# Patient Record
Sex: Female | Born: 1945 | Race: White | Hispanic: No | Smoking: Never smoker
Health system: Southern US, Community
[De-identification: ages and names within clinical notes are randomized; demographics above are authoritative.]

## PROBLEM LIST (undated history)

## (undated) DIAGNOSIS — I10 Essential (primary) hypertension: Secondary | ICD-10-CM

## (undated) DIAGNOSIS — K5900 Constipation, unspecified: Secondary | ICD-10-CM

## (undated) HISTORY — DX: Constipation, unspecified: K59.00

---

## 2017-07-20 ENCOUNTER — Encounter: Payer: Self-pay | Admitting: Emergency Medicine

## 2017-07-20 ENCOUNTER — Other Ambulatory Visit: Payer: Self-pay

## 2017-07-20 ENCOUNTER — Emergency Department
Admission: EM | Admit: 2017-07-20 | Discharge: 2017-07-20 | Disposition: A | Discharge status: Home / Self Care | Payer: Medicare Other | Attending: Emergency Medicine | Admitting: Emergency Medicine

## 2017-07-20 DIAGNOSIS — Z760 Encounter for issue of repeat prescription: Secondary | ICD-10-CM

## 2017-07-20 DIAGNOSIS — I10 Essential (primary) hypertension: Secondary | ICD-10-CM | POA: Diagnosis not present

## 2017-07-20 HISTORY — DX: Essential (primary) hypertension: I10

## 2017-07-20 MED ORDER — HYDROCHLOROTHIAZIDE 25 MG PO TABS: 25.0000 mg | ORAL_TABLET | Freq: Every day | ORAL | 1 refills | Status: DC

## 2017-07-20 NOTE — ED Provider Notes (Signed)
[facility] Emergency Department Provider Note  ____________________________________________   First MD Initiated Contact with Patient 07/20/17 1329     (approximate)  I have reviewed the triage vital signs and the nursing notes.   HISTORY  Chief Complaint Medication Refill   HPI Aimee Pace is a 72 y.o. female is here for refill of her hydrochlorothiazide.  Patient states that she takes 25 mg for her blood pressure.  She states that [place] to the [place] area and [place] doctor in [place] will not send her prescription for this medication.  Patient denies any other symptoms.  She states she has been out of medication for 1 month.  She denies any edema to her lower extremities, shortness of breath or headache.  Past Medical History:  Diagnosis Date  . Hypertension     There are no active problems to display for this patient.   History reviewed. No pertinent surgical history.  Prior to Admission medications   Medication Sig Start Date End Date Taking? Authorizing Provider  hydrochlorothiazide (HYDRODIURIL) 25 MG tablet Take 1 tablet (25 mg total) by mouth daily. 07/20/17   Tommi RumpsSummers, Mcguire Gasparyan L, PA-C    Allergies Patient has no known allergies.  No family history on file.  Social History Social History   Tobacco Use  . Smoking status: Never Smoker  . Smokeless tobacco: Current User    Types: Snuff  Substance Use Topics  . Alcohol use: Not Currently  . Drug use: Never    Review of Systems Constitutional: No fever/chills Eyes: No visual changes. Cardiovascular: Denies chest pain. Respiratory: Denies shortness of breath. Musculoskeletal: Negative for back pain. Skin: Negative for rash. Neurological: Negative for headaches, focal weakness or numbness. ___________________________________________   PHYSICAL EXAM:  VITAL SIGNS: ED Triage Vitals  Enc Vitals Group     BP 07/20/17 1242 (!) 185/90     Pulse Rate  07/20/17 1242 76     Resp 07/20/17 1242 18     Temp 07/20/17 1242 98.3 F (36.8 C)     Temp Source 07/20/17 1242 Oral     SpO2 07/20/17 1242 98 %     Weight 07/20/17 1243 160 lb (72.6 kg)     Height 07/20/17 1243 5\' 7"  (1.702 m)     Head Circumference --      Peak Flow --      Pain Score 07/20/17 1243 0     Pain Loc --      Pain Edu? --      Excl. in [place]? --    Constitutional: Alert and oriented. Well appearing and in no acute distress. Eyes: Conjunctivae are normal.  Head: Atraumatic. Neck: No stridor.   Cardiovascular: Normal rate, regular rhythm. Grossly normal heart sounds.  Good peripheral circulation. Respiratory: Normal respiratory effort.  No retractions. Lungs CTAB. Gastrointestinal: Soft and nontender. No distention.  Musculoskeletal: No lower extremity tenderness nor edema.  No joint effusions. Neurologic:  Normal speech and language. No gross focal neurologic deficits are appreciated.  Skin:  Skin is warm, dry and intact. No rash noted. Psychiatric: Mood and affect are normal. Speech and behavior are normal.  ____________________________________________   LABS (all labs ordered are listed, but only abnormal results are displayed)  Labs Reviewed - No data to display  PROCEDURES  Procedure(s) performed: None  Procedures  Critical Care performed: No  ____________________________________________   INITIAL IMPRESSION / ASSESSMENT AND PLAN / ED COURSE  As part of my medical decision making, I  reviewed the following data within the electronic MEDICAL RECORD NUMBER Notes from prior ED visits and [place] Controlled Substance Database  Patient is in the emergency department to obtain a prescription for her blood pressure medication until she can be seen at [facility].  Patient has been out of hydrochlorothiazide for 1 month.  She denies any other medication.  She is here for no other medical reasons. ____________________________________________   FINAL CLINICAL  IMPRESSION(S) / ED DIAGNOSES  Final diagnoses:  Encounter for medication refill  Hypertension, unspecified type     ED Discharge Orders        Ordered    hydrochlorothiazide (HYDRODIURIL) 25 MG tablet  Daily     07/20/17 1406       Note:  This document was prepared using Dragon voice recognition software and may include unintentional dictation errors.    Tommi Rumps, PA-C 07/20/17 1413    Emily Filbert, MD 07/20/17 [phone]

## 2017-07-20 NOTE — ED Triage Notes (Signed)
Pt has been out of her HCTZ 25mg  PO daily for almost one month. [place]. Pt has appt in May.

## 2017-07-20 NOTE — ED Notes (Signed)
Pt ambulatory to POV without difficulty. VSS. NAD. Discharge instructions, RX and follow up discussed. All questions answered.  

## 2017-07-20 NOTE — Discharge Instructions (Addendum)
Keep your appointment with your doctor as scheduled.  You have a prescription for hydrochlorthiazide 25 mg to be taken once daily.  There is a prescription for 30 days along with 1 refill until you are seen.   Blood pressure initially in the emergency department was 185/90.  Prior to discharge blood pressure 120/89.

## 2017-07-20 NOTE — ED Notes (Signed)
See triage note  States she is out of her b/p meds  Last time she took any was about 1 month ago  Denies any sx's

## 2017-10-26 ENCOUNTER — Encounter: Payer: Self-pay | Admitting: *Deleted

## 2021-05-19 ENCOUNTER — Other Ambulatory Visit: Payer: Self-pay

## 2021-05-19 ENCOUNTER — Emergency Department: Payer: Medicare HMO

## 2021-05-19 ENCOUNTER — Emergency Department
Admission: EM | Admit: 2021-05-19 | Discharge: 2021-05-19 | Disposition: A | Discharge status: Home / Self Care | Payer: Medicare HMO | Attending: Emergency Medicine | Admitting: Emergency Medicine

## 2021-05-19 DIAGNOSIS — R339 Retention of urine, unspecified: Secondary | ICD-10-CM

## 2021-05-19 DIAGNOSIS — N390 Urinary tract infection, site not specified: Secondary | ICD-10-CM | POA: Diagnosis not present

## 2021-05-19 DIAGNOSIS — K5900 Constipation, unspecified: Secondary | ICD-10-CM | POA: Diagnosis present

## 2021-05-19 LAB — CBC
HCT: 40.8 % (ref 36.0–46.0)
Hemoglobin: 13.8 g/dL (ref 12.0–15.0)
MCH: 30.6 pg (ref 26.0–34.0)
MCHC: 33.8 g/dL (ref 30.0–36.0)
MCV: 90.5 fL (ref 80.0–100.0)
Platelets: 265 10*3/uL (ref 150–400)
RBC: 4.51 MIL/uL (ref 3.87–5.11)
RDW: 13 % (ref 11.5–15.5)
WBC: 9.1 10*3/uL (ref 4.0–10.5)
nRBC: 0 % (ref 0.0–0.2)

## 2021-05-19 LAB — COMPREHENSIVE METABOLIC PANEL
ALT: 12 U/L (ref 0–44)
AST: 20 U/L (ref 15–41)
Albumin: 3.7 g/dL (ref 3.5–5.0)
Alkaline Phosphatase: 45 U/L (ref 38–126)
Anion gap: 13 (ref 5–15)
BUN: 25 mg/dL — ABNORMAL HIGH (ref 8–23)
CO2: 29 mmol/L (ref 22–32)
Calcium: 9.5 mg/dL (ref 8.9–10.3)
Chloride: 98 mmol/L (ref 98–111)
Creatinine, Ser: 0.88 mg/dL (ref 0.44–1.00)
GFR, Estimated: >60 mL/min (ref >60)
Glucose, Bld: 124 mg/dL — ABNORMAL HIGH (ref 70–99)
Potassium: 2.9 mmol/L — ABNORMAL LOW (ref 3.5–5.1)
Sodium: 140 mmol/L (ref 135–145)
Total Bilirubin: 0.8 mg/dL (ref 0.3–1.2)
Total Protein: 7.2 g/dL (ref 6.5–8.1)

## 2021-05-19 LAB — URINALYSIS, ROUTINE W REFLEX MICROSCOPIC
Bilirubin Urine: NEGATIVE
Glucose, UA: NEGATIVE mg/dL
Ketones, ur: 5 mg/dL — AB
Nitrite: NEGATIVE
Protein, ur: 30 mg/dL — AB
Specific Gravity, Urine: 1.024 (ref 1.005–1.030)
WBC, UA: >50 WBC/hpf — ABNORMAL HIGH (ref 0–5)
pH: 5 (ref 5.0–8.0)

## 2021-05-19 LAB — LIPASE, BLOOD: Lipase: 22 U/L (ref 11–51)

## 2021-05-19 IMAGING — CT CT ABD-PELV W/ CM
2 of 5 series · 15 of 46 positions shown, 17 images · IV contrast (APPLIED)
Comparison: None.

CLINICAL DATA: Abdominal pain.  Constipation

EXAM:
CT ABDOMEN AND PELVIS WITH CONTRAST
TECHNIQUE: Multidetector CT imaging of the abdomen and pelvis was performed
using the standard protocol following bolus administration of
intravenous contrast.

[Series 2: routine abd/pel with · axial · 0.77mm/px · z∈[-675,-275]mm · 12 of 92 slices shown, 14 images]
[im 6/92  soft-tissue]
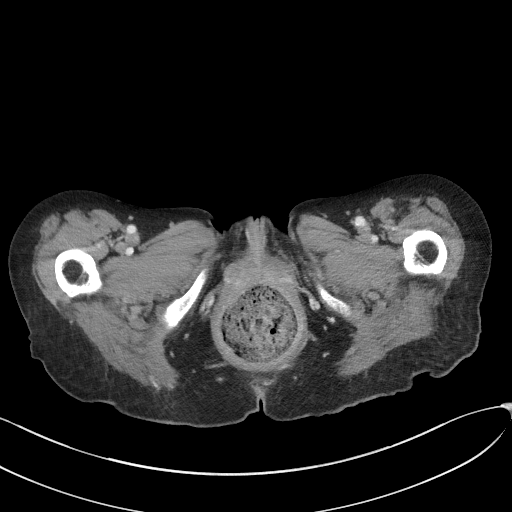
[im 6/92  bone]
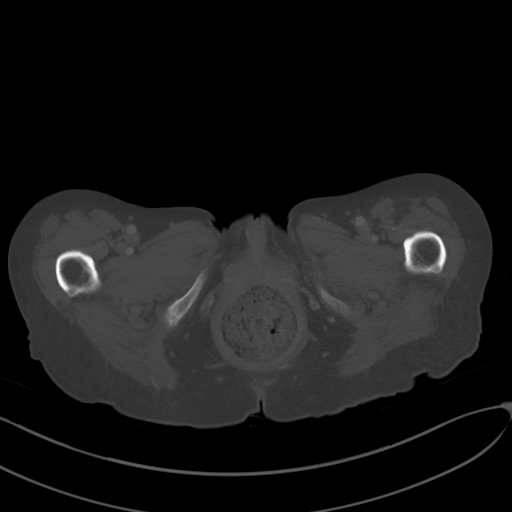
[im 12/92  soft-tissue]
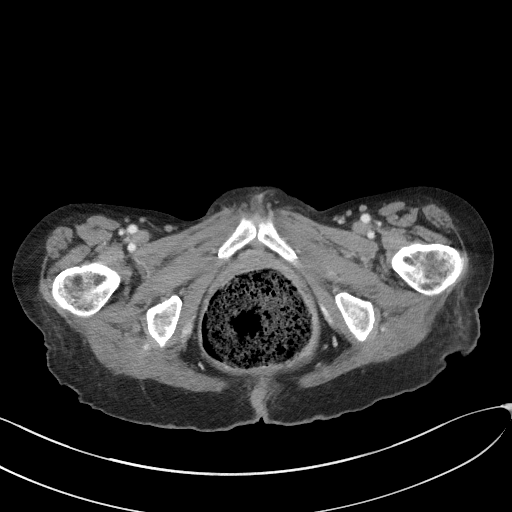
[im 23/92  soft-tissue]
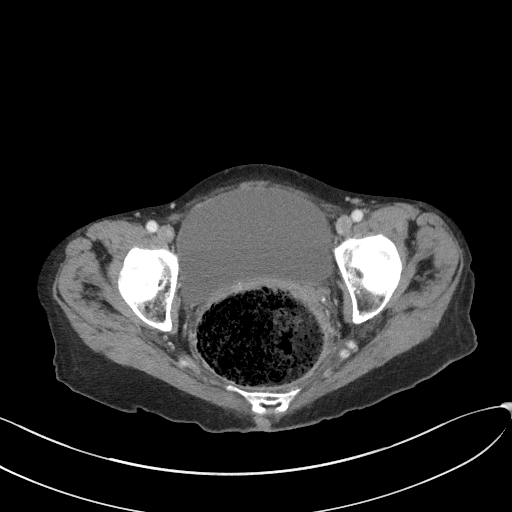
[im 29/92  soft-tissue]
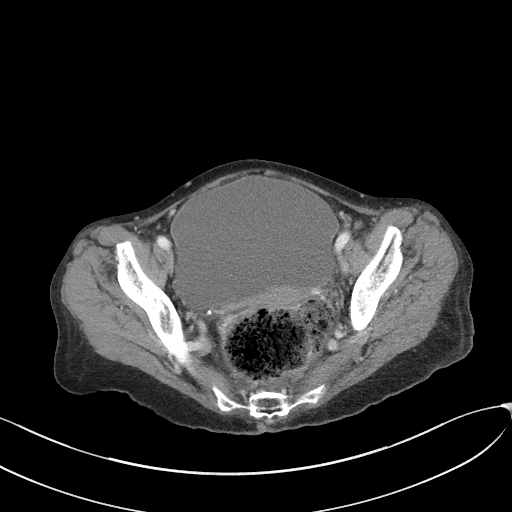
[im 35/92  soft-tissue]
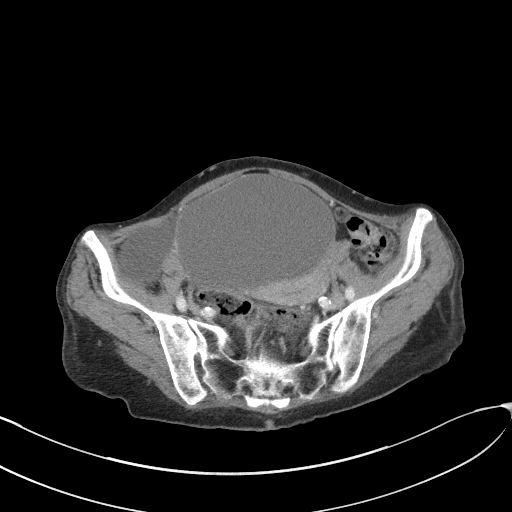
[im 40/92  soft-tissue]
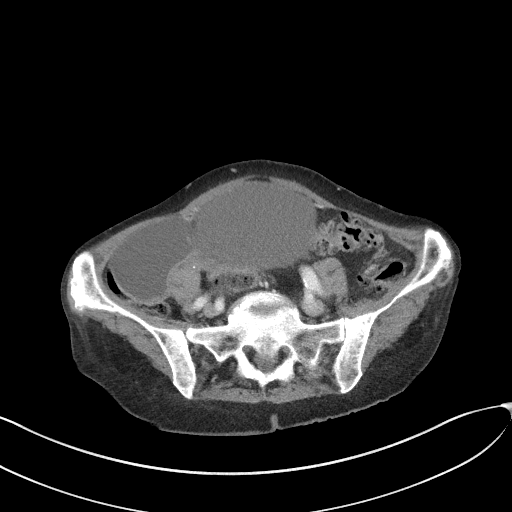
[im 52/92  soft-tissue]
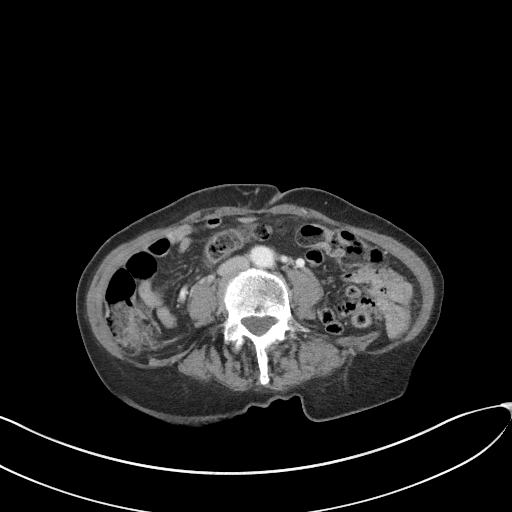
[im 57/92  soft-tissue]
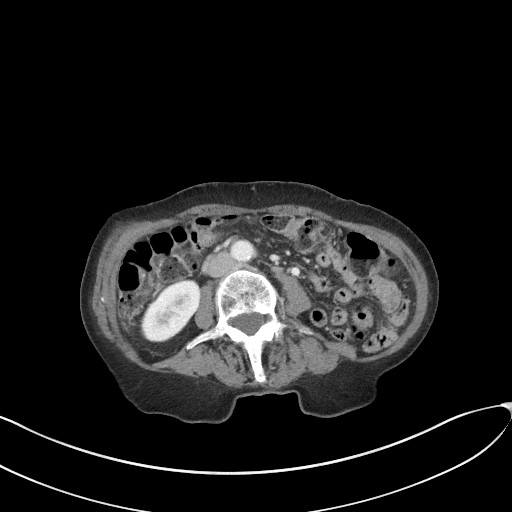
[im 63/92  soft-tissue]
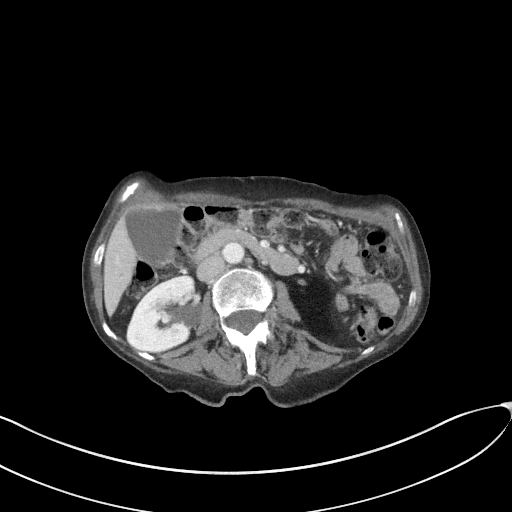
[im 63/92  bone]
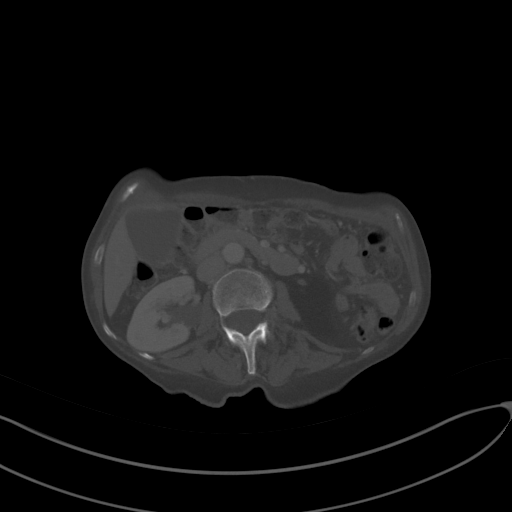
[im 69/92  soft-tissue]
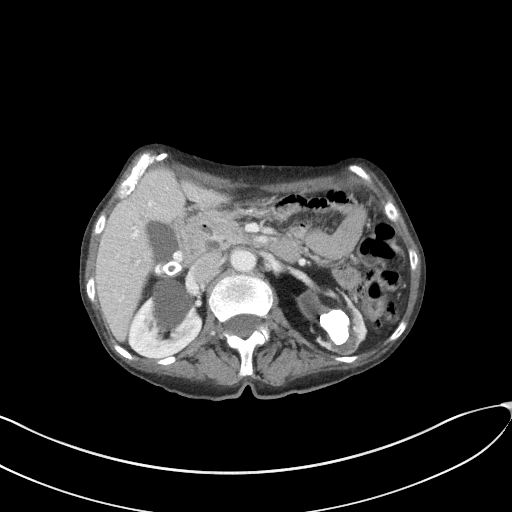
[im 80/92  soft-tissue]
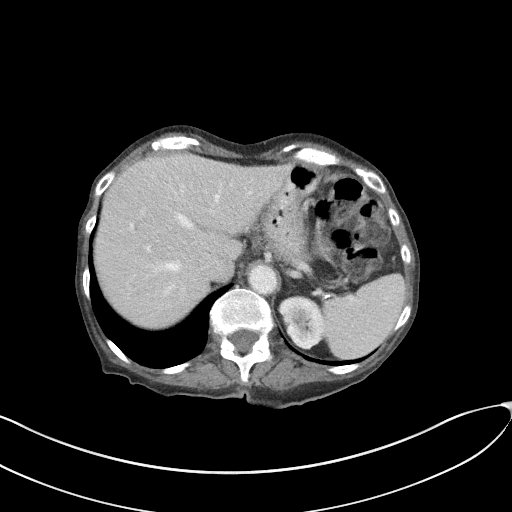
[im 86/92  soft-tissue]
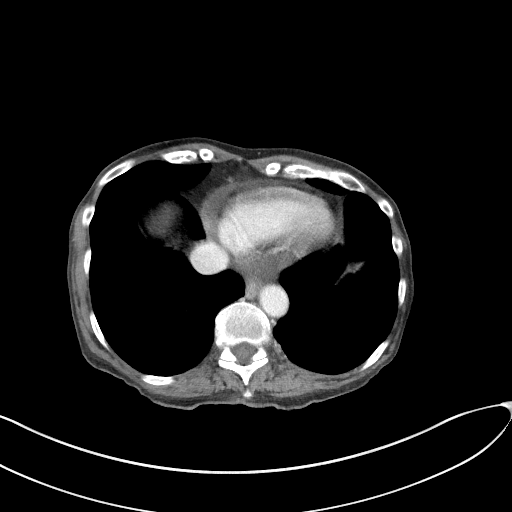

[Series 5: coronal st · coronal · 0.74mm/px · 3 of 72 slices shown]
[im 24/72  soft-tissue]
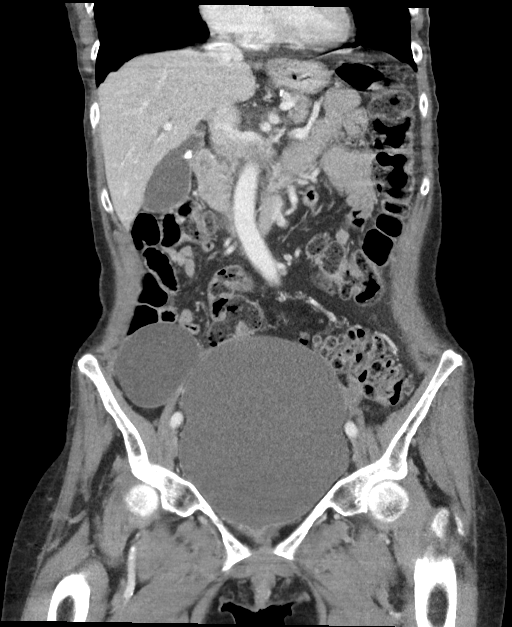
[im 32/72  soft-tissue]
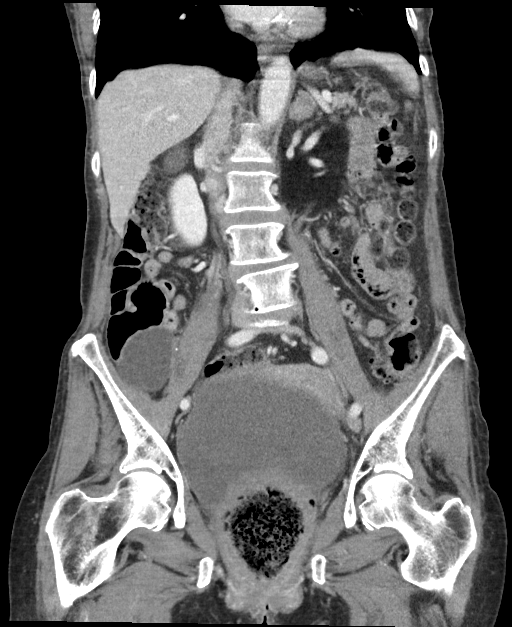
[im 40/72  soft-tissue]
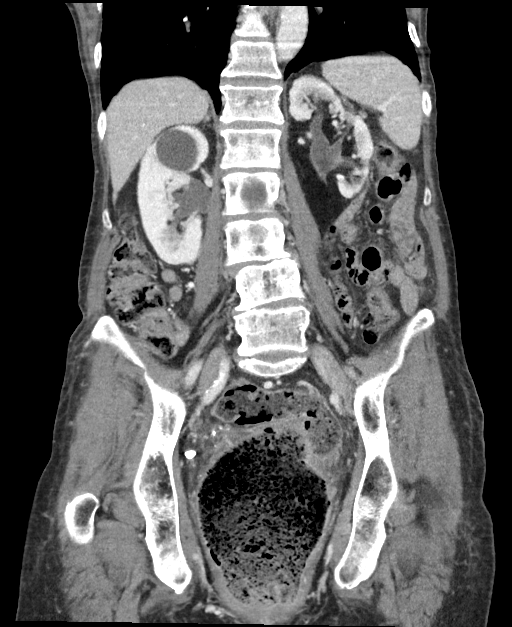

[15 of 46 positions shown; findings below may reference images not displayed]

RADIATION DOSE REDUCTION: This exam was performed according to the
departmental dose-optimization program which includes automated
exposure control, adjustment of the mA and/or kV according to
patient size and/or use of iterative reconstruction technique.

CONTRAST:  100mL OMNIPAQUE IOHEXOL 300 MG/ML  SOLN
FINDINGS: Lower chest: Lung bases are clear.

Hepatobiliary: Small hypodense lesion in the RIGHT hepatic lobe
measuring 4 mm (image 13) is too small to characterize

Several large faceted gallstones are present. No evidence acute
cholecystitis. No intrahepatic duct dilatation. The extrahepatic
bile duct is moderately dilated with the common bile duct measuring
9 mm (image 22/series 5). Within the distal common bile duct there
is soft tissue density measuring 6 mm (image [DATE]).

Pancreas: No pancreatic ductal dilatation or inflammation.

Spleen: Normal spleen

Adrenals/urinary tract: Thickening of the LEFT adrenal gland to 2 cm
(image [DATE]). Thickened LEFT adrenal gland meets contrast washout
criteria (greater than 40%) consistent with a benign adenoma. RIGHT
adrenal gland normal.

There is a bulky calcification in the LEFT renal hilum measuring
x 1.8 cm. There is chronic renal cortical thinning of the LEFT
kidney. No hydroureter on the LEFT.

The RIGHT kidney is normal. There is a nonenhancing cyst in the mid
RIGHT kidney measuring 3.6 cm.

The bladder is distended measuring 14.5 x 12.2 x 8.9 cm (volume =
820 cm^3).

Stomach/Bowel: Stomach, duodenum and small-bowel normal. Appendix
not clearly identified. Ascending and transverse colon normal.
Multiple diverticula descending colon sigmoid colon. Normal stool
volume in the descending colon. Large volume stool expands the
rectal vault to 10.0 7.8 cm in axial dimension (image 79/2)

Vascular/Lymphatic: Abdominal aorta is normal caliber. No periportal
or retroperitoneal adenopathy. No pelvic adenopathy.

Reproductive: Uterus is normal. Large cystic lesion in the RIGHT
iliac fossa has a thin rim of soft tissue suggesting RIGHT ovary
origin. Cyst measures 6.1 x 4.5 cm (image 54/series 2). The density
of fluid within the cyst is less than urine within the bladder. LEFT
ovary normal.

Other: No free fluid.

Musculoskeletal: No aggressive osseous lesion.
IMPRESSION: 1. Large volume of stool fills the rectal vault. Findings suggest
constipation. Minimal stool volume elsewhere in the colon.
2. LEFT colon diverticulosis without evidence acute diverticulitis.
3. Extrahepatic biliary duct dilatation with potential small filling
defect within the distal common bile duct. This filling defect is
less dense than the stones within the gallbladder may represent
sludge. No evidence acute cholecystitis. Recommend correlation with
bilirubin levels and if elevated consider further evaluation.
4. Markedly distended bladder (820 cc). Large calculus within the
LEFT renal pelvis with chronic LEFT renal cortical atrophy.
5. Benign LEFT adrenal adenoma.
6. Cystic enlargement of the RIGHT ovary. Recommend follow-up US in
6-12 months. Note: This recommendation does not apply to
premenarchal patients and to those with increased risk (genetic,
family history, elevated tumor markers or other high-risk factors)
of ovarian cancer. Reference: JACR [DATE]):248-254

## 2021-05-19 MED ORDER — DOCUSATE SODIUM 100 MG PO CAPS: 100.0000 mg | ORAL_CAPSULE | Freq: Two times a day (BID) | ORAL | 0 refills | Status: AC

## 2021-05-19 MED ORDER — POTASSIUM CHLORIDE CRYS ER 10 MEQ PO TBCR: 10.0000 meq | EXTENDED_RELEASE_TABLET | Freq: Two times a day (BID) | ORAL | 0 refills | Status: DC

## 2021-05-19 MED ORDER — CEPHALEXIN 500 MG PO CAPS: 500.0000 mg | ORAL_CAPSULE | Freq: Three times a day (TID) | ORAL | 0 refills | Status: DC

## 2021-05-19 MED ORDER — IOHEXOL 300 MG/ML  SOLN
100.0000 mL | Freq: Once | INTRAMUSCULAR | Status: AC | PRN
  Administered 2021-05-19: 100 mL via INTRAVENOUS
  Filled 2021-05-19: qty 100

## 2021-05-19 MED ORDER — SODIUM CHLORIDE 0.9 % IV SOLN
1.0000 g | Freq: Once | INTRAVENOUS | Status: AC
  Administered 2021-05-19: 1 g via INTRAVENOUS
  Filled 2021-05-19: qty 10

## 2021-05-19 MED ORDER — POLYETHYLENE GLYCOL 3350 17 GM/SCOOP PO POWD: ORAL | 0 refills | Status: DC

## 2021-05-19 MED ORDER — SODIUM CHLORIDE 0.9 % IV BOLUS
1000.0000 mL | Freq: Once | INTRAVENOUS | Status: AC
  Administered 2021-05-19: 1000 mL via INTRAVENOUS

## 2021-05-19 NOTE — ED Notes (Signed)
Pt to ED brought by nephew, pt states LBM was 10 days ago and has abdominal discomfort in lower abdomen. Pt also states that rectum is sore. Pt denies urinary symptoms.  Pt has unkept appearance and smells of urine and feces. Clothing appears to have dried feces and back of pants appear wet. Will clean up pt with other nurse.   EDP examining pt.

## 2021-05-19 NOTE — ED Notes (Signed)
Tried to call family of pt with 2 listed numbers. No answer at either number. Pt is up for discharge.

## 2021-05-19 NOTE — ED Triage Notes (Signed)
Pt here with abd pain and constipation. Pt states that she has not had a BM in 7 days. Pt has generalized abd pain, Pt in NAD in triage.

## 2021-05-19 NOTE — ED Notes (Signed)
Peri care was performed and foley catheter was placed. EDP at bedside for manual disimpaction.

## 2021-05-19 NOTE — ED Provider Notes (Signed)
[facility] Provider Note    Event Date/Time   First MD Initiated Contact with Patient 05/19/21 1206     (approximate)  History   Chief Complaint: Abdominal Pain and Constipation  HPI  Aimee Pace is a 76 y.o. female with a past medical history of hypertension presents to the emergency department for constipation and abdominal pain.  According to the patient for the past week or week and a half she has been experiencing constipation as well as lower abdominal pain.  Denies any fever denies any nausea or vomiting.  Describes abdominal pain as mild to moderate and intermittent across the lower abdomen mostly in the left lower quadrant.  Denies any urinary symptoms.  Physical Exam   Triage Vital Signs: ED Triage Vitals  Enc Vitals Group     BP 05/19/21 1146 (!) 145/111     Pulse Rate 05/19/21 1146 (!) 109     Resp 05/19/21 1146 18     Temp 05/19/21 1146 98.3 F (36.8 C)     Temp Source 05/19/21 1146 Oral     SpO2 05/19/21 1146 99 %     Weight 05/19/21 1147 118 lb (53.5 kg)     Height 05/19/21 1147 5\' 7"  (1.702 m)     Head Circumference --      Peak Flow --      Pain Score 05/19/21 1146 10     Pain Loc --      Pain Edu? --      Excl. in [place]? --     Most recent vital signs: Vitals:   05/19/21 1146  BP: (!) 145/111  Pulse: (!) 109  Resp: 18  Temp: 98.3 F (36.8 C)  SpO2: 99%    General: Awake, no distress.  CV:  Good peripheral perfusion.  Regular rate and rhythm  Resp:  Normal effort.  Equal breath sounds bilaterally.  Abd:  Soft, mild left lower quadrant tenderness to palpation without rebound guarding or distention.  Otherwise benign abdomen.    ED Results / Procedures / Treatments   RADIOLOGY  I have personally reviewed the CT images patient appears to have significant bladder distention as well as significant rectal stool/impaction. CT read by radiology as IMPRESSION:  1. Large volume of stool fills the rectal vault. Findings  suggest  constipation. Minimal stool volume elsewhere in the colon.  2. LEFT colon diverticulosis without evidence acute diverticulitis.  3. Extrahepatic biliary duct dilatation with potential small filling  defect within the distal common bile duct. This filling defect is  less dense than the stones within the gallbladder may represent  sludge. No evidence acute cholecystitis. Recommend correlation with  bilirubin levels and if elevated consider further evaluation.  4. Markedly distended bladder (820 cc). Large calculus within the  LEFT renal pelvis with chronic LEFT renal cortical atrophy.  5. Benign LEFT adrenal adenoma.  6. Cystic enlargement of the RIGHT ovary. Recommend follow-up US in  6-12 months. Note: This recommendation does not apply to  premenarchal patients and to those with increased risk (genetic,  family history, elevated tumor markers or other high-risk factors)  of ovarian cancer. Reference: JACR 2020 Feb; 17(2):248-254    MEDICATIONS ORDERED IN ED: Medications  sodium chloride 0.9 % bolus 1,000 mL (has no administration in time range)     IMPRESSION / MDM / ASSESSMENT AND PLAN / ED COURSE  I reviewed the triage vital signs and the nursing notes.  Patient presents to the emergency department for  constipation x10 days and lower abdominal pain.  Given the patient's constipation x10 days with tenderness to palpation in the left lower quadrant we will obtain CT imaging the abdomen/pelvis to further evaluate.  Differential would include diverticulitis, colitis, proctitis, constipation, bowel obstruction.  We will also check labs to ensure no metabolic or electrolyte abnormality dehydration, etc.  We will check a urinalysis as well to ensure no UTI.  Patient agreeable to plan of care.  Patient's lab work is overall reassuring slight hypokalemia we will place on oral supplementation.  CBC is normal including a normal white blood cell count.  Urine is pending.  Patient's  urinalysis shows significant urinary tract infection.  We will dose IV Rocephin.  CT images show significant bladder distention we will place a Foley catheter.  Concerning for possible fecal impaction.  I have performed a digital disimpaction and removed a very large amount of stool from the patient's rectum.  Remaining stool is soft.  We will perform an enema and continue to closely monitor.  Overall patient appears well and I believe would be appropriate for discharge home given her reassuring work-up with close follow-up.  We will leave the urinary catheter in place, place the patient on Keflex 3 times daily for 10 days as well as Colace twice daily.  Patient agreeable to plan of care.    FINAL CLINICAL IMPRESSION(S) / ED DIAGNOSES   Low abdominal pain Constipation Urinary retention Urinary tract infection  Rx / DC Orders   Please follow-up with your primary care doctor in the next 2 to 3 days for recheck/reevaluation. Keflex Colace Potassium supplements x7 days  Note:  This document was prepared using Dragon voice recognition software and may include unintentional dictation errors.   Harvest Dark, MD 05/19/21 [phone]

## 2021-05-19 NOTE — Discharge Instructions (Addendum)
Your evaluation today revealed significant constipation as well as a urinary tract infection and urinary retention.  A catheter was used to drain your bladder, and you were given antibiotics to treat the bladder infection.  Please follow-up with your doctor.  Please finish a course of prescribed antibiotics.  Please use your stool softener twice daily as prescribed.  Please drink plenty of fluids and take your potassium supplements as written for the next 7 days.  Please follow-up with your doctor in the next 2 to 3 days for recheck/reevaluation to ensure you are able to urinate normally.

## 2021-05-19 NOTE — ED Provider Notes (Signed)
Procedures     ----------------------------------------- 6:15 PM on 05/19/2021 ----------------------------------------- Patient reports her pain is resolved.  She feels very comfortable currently.  No rectal pain or pressure.  She did have a bowel movement here in the ED.  Abdominal exam is soft, nondistended, completely nontender.  Stable for discharge at this time.     Sharman Cheek, MD 05/19/21 1816

## 2021-05-19 NOTE — ED Notes (Signed)
Pt cleaned into new brief and put on new pants. Pt nephew at bedside. Discharge papers, and prescriptions explained to nephew.

## 2021-05-19 NOTE — ED Notes (Signed)
Pt assisted to bedside commode at this time. Call bell in reach and pt instructed not to get up without calling for help. Pt verbalized understanding.

## 2021-05-20 LAB — URINE CULTURE

## 2022-04-01 ENCOUNTER — Other Ambulatory Visit: Payer: Self-pay | Admitting: Nurse Practitioner

## 2022-04-01 DIAGNOSIS — Z780 Asymptomatic menopausal state: Secondary | ICD-10-CM

## 2022-04-01 DIAGNOSIS — Z1231 Encounter for screening mammogram for malignant neoplasm of breast: Secondary | ICD-10-CM

## 2024-04-30 ENCOUNTER — Ambulatory Visit: Payer: Self-pay | Admitting: Internal Medicine

## 2024-04-30 ENCOUNTER — Encounter: Payer: Self-pay | Admitting: Internal Medicine

## 2024-04-30 VITALS — BP 126/84 | HR 94 | Temp 98.9°F | Ht 67.0 in | Wt 128.2 lb

## 2024-04-30 DIAGNOSIS — I10 Essential (primary) hypertension: Secondary | ICD-10-CM | POA: Insufficient documentation

## 2024-04-30 DIAGNOSIS — Z0001 Encounter for general adult medical examination with abnormal findings: Secondary | ICD-10-CM

## 2024-04-30 DIAGNOSIS — Z780 Asymptomatic menopausal state: Secondary | ICD-10-CM | POA: Diagnosis not present

## 2024-04-30 MED ORDER — HYDROCHLOROTHIAZIDE 25 MG PO TABS: 25.0000 mg | ORAL_TABLET | Freq: Every day | ORAL | 1 refills | Status: AC

## 2024-04-30 MED ORDER — LISINOPRIL 5 MG PO TABS: 5.0000 mg | ORAL_TABLET | Freq: Every day | ORAL | 1 refills | Status: AC

## 2024-04-30 NOTE — Progress Notes (Signed)
 "  Established Patient Office Visit  Subjective:  Patient ID: Aimee Pace, female    DOB: 1946-01-15  Age: 79 y.o. MRN: [id number]  Chief Complaint  Patient presents with   Establish Care    NPE    Here to establish IM care with pmh of HTN. No complaints today.    No other concerns at this time.   Past Medical History:  Diagnosis Date   Constipation    Hypertension     History reviewed. No pertinent surgical history.  Social History   Socioeconomic History   Marital status: Widowed    Spouse name: Not on file   Number of children: Not on file   Years of education: Not on file   Highest education level: Not on file  Occupational History   Not on file  Tobacco Use   Smoking status: Never   Smokeless tobacco: Current    Types: Chew  Vaping Use   Vaping status: Never Used  Substance and Sexual Activity   Alcohol use: Not Currently   Drug use: Never   Sexual activity: Not Currently  Other Topics Concern   Not on file  Social History Narrative   Not on file   Social Drivers of Health   Tobacco Use: High Risk (04/30/2024)   Patient History    Smoking Tobacco Use: Never    Smokeless Tobacco Use: Current    Passive Exposure: Not on file  Financial Resource Strain: Not on file  Food Insecurity: Not on file  Transportation Needs: Not on file  Physical Activity: Not on file  Stress: Not on file  Social Connections: Not on file  Intimate Partner Violence: Not on file  Depression (EYV7-0): Not on file  Alcohol Screen: Not on file  Housing: Not on file  Utilities: Not on file  Health Literacy: Not on file    History reviewed. No pertinent family history.  Allergies[1]  Show/hide medication list[2]  Review of Systems  Constitutional: Negative.  Negative for malaise/fatigue and weight loss.  HENT: Negative.    Eyes: Negative.   Respiratory: Negative.    Cardiovascular: Negative.   Gastrointestinal: Negative.   Genitourinary: Negative.   Skin: Negative.    Neurological: Negative.   Endo/Heme/Allergies: Negative.        Objective:   BP 126/84   Pulse 94   Temp 98.9 F (37.2 C)   Ht 5' 7 (1.702 m)   Wt 128 lb 3.2 oz (58.2 kg)   SpO2 96%   BMI 20.08 kg/m   Vitals:   04/30/24 1358  BP: 126/84  Pulse: 94  Temp: 98.9 F (37.2 C)  Height: 5' 7 (1.702 m)  Weight: 128 lb 3.2 oz (58.2 kg)  SpO2: 96%  BMI (Calculated): 20.07    Physical Exam Vitals reviewed.  Constitutional:      General: She is not in acute distress.    Appearance: She is well-developed.     Comments: Dyed hair  HENT:     Head: Normocephalic.     Nose: Nose normal.     Mouth/Throat:     Mouth: Mucous membranes are moist.  Eyes:     Extraocular Movements: Extraocular movements intact.     Pupils: Pupils are equal, round, and reactive to light.  Cardiovascular:     Rate and Rhythm: Normal rate and regular rhythm.     Heart sounds: No murmur heard. Pulmonary:     Effort: Pulmonary effort is normal.     Breath sounds:  No rhonchi or rales.  Abdominal:     General: Abdomen is flat.     Palpations: There is no hepatomegaly, splenomegaly or mass.  Musculoskeletal:        General: Normal range of motion.     Cervical back: Normal range of motion. No tenderness.  Skin:    General: Skin is warm and dry.  Neurological:     General: No focal deficit present.     Mental Status: She is alert and oriented to person, place, and time.     Cranial Nerves: No cranial nerve deficit.     Motor: No weakness.  Psychiatric:        Mood and Affect: Mood normal.        Behavior: Behavior normal.      No results found for any visits on 04/30/24.  No results found for this or any previous visit (from the past 2160 hours).    Assessment & Plan:  Madia was seen today for establish care.  Primary hypertension  Menopause  Encounter for well adult exam with abnormal findings    Problem List Items Addressed This Visit       Cardiovascular and  Mediastinum   Primary hypertension - Primary   Relevant Medications   lisinopril  (ZESTRIL ) 5 MG tablet   Other Visit Diagnoses       Menopause         Encounter for well adult exam with abnormal findings           Return in about 2 weeks (around 05/14/2024) for awv with labs prior.   Total time spent: 30 minutes. This time includes review of previous notes and results and patient face to face interaction during today'Khilee Hendricksen visit.    Sherrill Cinderella Perry, MD  04/30/2024   This document may have been prepared by [facility] Voice Recognition software and as such may include unintentional dictation errors.     [1] No Known Allergies [2]  Outpatient Medications Prior to Visit  Medication Sig   hydrochlorothiazide  (HYDRODIURIL ) 25 MG tablet Take 1 tablet (25 mg total) by mouth daily.   lisinopril  (ZESTRIL ) 5 MG tablet Take 5 mg by mouth daily.   cephALEXin  (KEFLEX ) 500 MG capsule Take 1 capsule (500 mg total) by mouth 3 (three) times daily. (Patient not taking: Reported on 04/30/2024)   polyethylene glycol powder (GLYCOLAX /MIRALAX ) 17 GM/SCOOP powder 1 cap full in a full glass of water, two times a day for 3 days. (Patient not taking: Reported on 04/30/2024)   [DISCONTINUED] potassium chloride  (KLOR-CON  M) 10 MEQ tablet Take 1 tablet (10 mEq total) by mouth 2 (two) times daily. (Patient not taking: Reported on 04/30/2024)   No facility-administered medications prior to visit.   "

## 2024-05-15 ENCOUNTER — Ambulatory Visit: Admitting: Internal Medicine

## 2024-06-04 ENCOUNTER — Ambulatory Visit: Admitting: Internal Medicine
